# Patient Record
Sex: Female | Born: 1975 | Race: White | Hispanic: No | Marital: Married | Smoking: Former smoker
Health system: Southern US, Community
[De-identification: ages and names within clinical notes are randomized; demographics above are authoritative.]

## PROBLEM LIST (undated history)

## (undated) DIAGNOSIS — L309 Dermatitis, unspecified: Secondary | ICD-10-CM

## (undated) DIAGNOSIS — T7840XA Allergy, unspecified, initial encounter: Secondary | ICD-10-CM

## (undated) DIAGNOSIS — J3089 Other allergic rhinitis: Secondary | ICD-10-CM

## (undated) DIAGNOSIS — Z8619 Personal history of other infectious and parasitic diseases: Secondary | ICD-10-CM

## (undated) DIAGNOSIS — C44519 Basal cell carcinoma of skin of other part of trunk: Secondary | ICD-10-CM

## (undated) HISTORY — DX: Dermatitis, unspecified: L30.9

## (undated) HISTORY — DX: Other allergic rhinitis: J30.89

## (undated) HISTORY — DX: Basal cell carcinoma of skin of other part of trunk: C44.519

## (undated) HISTORY — DX: Personal history of other infectious and parasitic diseases: Z86.19

## (undated) HISTORY — DX: Allergy, unspecified, initial encounter: T78.40XA

---

## 1998-07-06 ENCOUNTER — Other Ambulatory Visit: Admission: RE | Admit: 1998-07-06 | Discharge: 1998-07-06 | Payer: Self-pay | Admitting: Family Medicine

## 1998-07-07 ENCOUNTER — Ambulatory Visit (HOSPITAL_COMMUNITY): Admission: RE | Admit: 1998-07-07 | Discharge: 1998-07-07 | Payer: Self-pay | Admitting: Family Medicine

## 1998-07-07 ENCOUNTER — Encounter: Payer: Self-pay | Admitting: Family Medicine

## 1998-07-12 ENCOUNTER — Ambulatory Visit (HOSPITAL_COMMUNITY): Admission: RE | Admit: 1998-07-12 | Discharge: 1998-07-12 | Payer: Self-pay | Admitting: Family Medicine

## 1998-07-12 ENCOUNTER — Encounter: Payer: Self-pay | Admitting: Family Medicine

## 1998-08-24 ENCOUNTER — Ambulatory Visit (HOSPITAL_COMMUNITY): Admission: RE | Admit: 1998-08-24 | Discharge: 1998-08-24 | Payer: Self-pay | Admitting: *Deleted

## 1998-08-24 ENCOUNTER — Encounter: Payer: Self-pay | Admitting: Family Medicine

## 1999-08-31 ENCOUNTER — Emergency Department (HOSPITAL_COMMUNITY): Admission: EM | Admit: 1999-08-31 | Discharge: 1999-08-31 | Payer: Self-pay | Admitting: Emergency Medicine

## 1999-09-02 ENCOUNTER — Emergency Department (HOSPITAL_COMMUNITY): Admission: EM | Admit: 1999-09-02 | Discharge: 1999-09-02 | Payer: Self-pay | Admitting: Emergency Medicine

## 2000-11-26 ENCOUNTER — Inpatient Hospital Stay (HOSPITAL_COMMUNITY): Admission: AD | Admit: 2000-11-26 | Discharge: 2000-11-28 | Payer: Self-pay | Admitting: Family Medicine

## 2000-12-03 ENCOUNTER — Inpatient Hospital Stay (HOSPITAL_COMMUNITY): Admission: AD | Admit: 2000-12-03 | Discharge: 2000-12-03 | Payer: Self-pay | Admitting: *Deleted

## 2002-07-28 ENCOUNTER — Other Ambulatory Visit: Admission: RE | Admit: 2002-07-28 | Discharge: 2002-07-28 | Payer: Self-pay | Admitting: Obstetrics and Gynecology

## 2008-07-06 ENCOUNTER — Other Ambulatory Visit: Admission: RE | Admit: 2008-07-06 | Discharge: 2008-07-06 | Payer: Self-pay | Admitting: Obstetrics and Gynecology

## 2017-01-21 DIAGNOSIS — C44519 Basal cell carcinoma of skin of other part of trunk: Secondary | ICD-10-CM

## 2017-01-21 HISTORY — DX: Basal cell carcinoma of skin of other part of trunk: C44.519

## 2017-01-21 HISTORY — PX: BASAL CELL CARCINOMA EXCISION: SHX1214

## 2017-07-27 ENCOUNTER — Encounter: Payer: Self-pay | Admitting: Family Medicine

## 2017-07-27 ENCOUNTER — Ambulatory Visit: Payer: Managed Care, Other (non HMO) | Admitting: Family Medicine

## 2017-07-27 VITALS — BP 160/90 | HR 91 | Temp 98.2°F | Ht 65.0 in | Wt 148.4 lb

## 2017-07-27 DIAGNOSIS — Z85828 Personal history of other malignant neoplasm of skin: Secondary | ICD-10-CM | POA: Insufficient documentation

## 2017-07-27 DIAGNOSIS — F410 Panic disorder [episodic paroxysmal anxiety] without agoraphobia: Secondary | ICD-10-CM | POA: Insufficient documentation

## 2017-07-27 DIAGNOSIS — J3089 Other allergic rhinitis: Secondary | ICD-10-CM | POA: Insufficient documentation

## 2017-07-27 DIAGNOSIS — L209 Atopic dermatitis, unspecified: Secondary | ICD-10-CM | POA: Insufficient documentation

## 2017-07-27 DIAGNOSIS — F411 Generalized anxiety disorder: Secondary | ICD-10-CM

## 2017-07-27 DIAGNOSIS — Z23 Encounter for immunization: Secondary | ICD-10-CM

## 2017-07-27 HISTORY — DX: Other allergic rhinitis: J30.89

## 2017-07-27 MED ORDER — ALPRAZOLAM 0.25 MG PO TABS: 0.2500 mg | ORAL_TABLET | Freq: Every day | ORAL | 0 refills | Status: DC | PRN

## 2017-07-27 NOTE — Progress Notes (Signed)
Subjective  CC:  Chief Complaint  Patient presents with  . Establish Care  . Anxiety    worse past 4 months    HPI: Carolyn Carey is a 42 y.o. female who presents to [place] at [facility] today to establish care with me as a new patient.  She has the following concerns or needs:   Very pleasant 23 year old mother of 3 presents to establish care and discuss anxiety concerns.  She reports being diagnosed with panic disorder about 16 years ago.  Although in retrospect, she admits to having a high anxiety level since her adolescence.  Her GAD 7 score today is 19 and she reports that it is negatively affecting her.  She has chronic worry, fear of heights, intermittent panic attacks that can be triggered by height, driving, [place] etc., and poor sleep.  In the past she has used intermittent Xanax to treat panic attacks.  She feels that she can otherwise handle her chronic anxiety behaviorally.  Having the Xanax seems to help lower her level of anxiety.  She has been treated with Zoloft and Lexapro in the past and did not like the way those medicines made her feel.  It sounds like she felt too apathetic.  She denies symptoms of mania or impulsiveness.  She is high functioning.  She denies symptoms of depression.  Health maintenance: She is overdue for a physical exam and we will set that up.  She does have chronic allergies and eczema.  Both are well controlled  We updated and reviewed the patient's past history in detail and it is documented below.  Problem  Atopic Dermatitis  History of Basal Cell Carcinoma  Panic Disorder  Chronic Non-Seasonal Allergic Rhinitis   Health Maintenance  Topic Date Due  . HIV Screening  08/23/1990  . TETANUS/TDAP  08/23/1994  . PAP SMEAR  08/23/1996  . INFLUENZA VACCINE  02/21/2017   There is no immunization history for the selected administration types on file for this patient. Current Meds  Medication Sig  . betamethasone  dipropionate (DIPROLENE) 0.05 % cream Apply 1 application topically 2 (two) times daily.  . cetirizine (ZYRTEC) 10 MG tablet Take 10 mg by mouth daily.  . fluocinonide cream (LIDEX) 1.61 % Apply 1 application topically daily.  . hydrocortisone 2.5 % cream Apply 1 application topically 2 (two) times daily.    Allergies: Patient is allergic to sulfa antibiotics. Past Medical History Patient  has a past medical history of Allergy, Basal cell carcinoma (BCC) of back (01/2017), Chronic non-seasonal allergic rhinitis (07/27/2017), Eczema, and History of chicken pox. Past Surgical History Patient  has a past surgical history that includes Excision basal cell carcinoma (N/A, 01/2017). Family History: Patient family history includes Anxiety disorder in her maternal grandmother; Diabetes Mellitus I in her daughter; Healthy in her son; Hearing loss in her daughter; Heart attack (age of onset: 24) in her father; Heart disease in her father; Hyperlipidemia in her father; Hypertension in her mother; Melanoma in her other. Social History:  Patient  reports that she quit smoking about 5 years ago. Her smoking use included cigarettes. She has a 10.00 pack-year smoking history. she has never used smokeless tobacco. She reports that she drinks alcohol. She reports that she does not use drugs.  Review of Systems: Constitutional: negative for fever or malaise, no sweats Ophthalmic: negative for photophobia, double vision or loss of vision Cardiovascular: negative for chest pain, dyspnea on exertion, or new LE swelling, no palpitations  Respiratory: negative for SOB or persistent cough Gastrointestinal: negative for abdominal pain, change in bowel habits or melena, no diarrhea Genitourinary: negative for dysuria or gross hematuria Musculoskeletal: negative for new gait disturbance or muscular weakness Integumentary: negative for new or persistent rashes Neurological: negative for TIA or stroke symptoms, no  tremor Psychiatric: negative for SI or delusions Allergic/Immunologic: negative for hives  Patient Care Team    Relationship Specialty Notifications Start End  Leamon Arnt, MD PCP - General Family Medicine  07/27/17   Jarome Matin, MD Consulting Physician Dermatology  07/27/17     Objective  Vitals: BP (!) 160/90 (BP Location: Left Arm, Patient Position: Sitting, Cuff Size: Normal)   Pulse 91   Temp 98.2 F (36.8 C)   Ht 5\' 5"  (1.651 m)   Wt 148 lb 6.1 oz (67.3 kg)   LMP 07/02/2017   SpO2 99%   BMI 24.69 kg/m  General:  Well developed, well nourished, no acute distress  Psych:  Alert and oriented,normal mood and affect HEENT:  Normocephalic, atraumatic, non-icteric sclera,mass or thyromegaly Cardiovascular:  RRR without gallop, rub or murmur, nondisplaced PMI Respiratory:  Good breath sounds bilaterally, CTAB with normal respiratory effort MSK: no deformities, contusions. Joints are without erythema or swelling Skin:  Warm, no rashes or suspicious lesions noted Neurologic:    Mental status is normal. Gross motor and sensory exams are normal. Normal gait  Assessment  1. GAD (generalized anxiety disorder)   2. Panic disorder   3. History of basal cell carcinoma   4. Need for prophylactic vaccination and inoculation against influenza      Plan   General anxiety disorder with panic: Seems to be the correct diagnosis.  Discussed different treatment options including SSRI, SNRI, benzodiazepines for intermittent use, and cognitive behavioral therapy.  At this time, patient feels most comfortable with intermittent low-dose Xanax use.  We discussed how this will effectively treat an acute panic attack however does not improve or treat the underlying chronic anxiety levels.  It can help improve her ability to manage it knowing that she will have to suffer through severe panic attack, so for this reason I think it is reasonable.  Low-dose Xanax ordered and we will recheck at her follow-up  visit for a complete physical.  She declines therapy at this time due to cost  Health maintenance: Updated influenza vaccination today.  Follow up: Return for complete physical  Commons side effects, risks, benefits, and alternatives for medications and treatment plan prescribed today were discussed, and the patient expressed understanding of the given instructions. Patient is instructed to call or message via MyChart if he/she has any questions or concerns regarding our treatment plan. No barriers to understanding were identified. We discussed Red Flag symptoms and signs in detail. Patient expressed understanding regarding what to do in case of urgent or emergency type symptoms.   Medication list was reconciled, printed and provided to the patient in AVS. Patient instructions and summary information was reviewed with the patient as documented in the AVS. This note was prepared with assistance of Dragon voice recognition software. Occasional wrong-word or sound-a-like substitutions may have occurred due to the inherent limitations of voice recognition software  Orders Placed This Encounter  Procedures  . Flu Vaccine QUAD 36+ mos IM   No orders of the defined types were placed in this encounter.

## 2017-07-27 NOTE — Patient Instructions (Signed)
Please return in 1-6 months for your complete physical with blood work, please come fasting.  It was a pleasure meeting you today! Thank you for choosing [place] to meet your healthcare needs! I truly look forward to working with you. If you have any questions or concerns, please send me a message via Mychart or call the office at [phone].   Generalized Anxiety Disorder, Adult Generalized anxiety disorder (GAD) is a mental health disorder. People with this condition constantly worry about everyday events. Unlike normal anxiety, worry related to GAD is not triggered by a specific event. These worries also do not fade or get better with time. GAD interferes with life functions, including relationships, work, and school. GAD can vary from mild to severe. People with severe GAD can have intense waves of anxiety with physical symptoms (panic attacks). What are the causes? The exact cause of GAD is not known. What increases the risk? This condition is more likely to develop in:  Women.  People who have a family history of anxiety disorders.  People who are very shy.  People who experience very stressful life events, such as the death of a loved one.  People who have a very stressful family environment.  What are the signs or symptoms? People with GAD often worry excessively about many things in their lives, such as their health and family. They may also be overly concerned about:  Doing well at work.  Being on time.  Natural disasters.  Friendships.  Physical symptoms of GAD include:  Fatigue.  Muscle tension or having muscle twitches.  Trembling or feeling shaky.  Being easily startled.  Feeling like your heart is pounding or racing.  Feeling out of breath or like you cannot take a deep breath.  Having trouble falling asleep or staying asleep.  Sweating.  Nausea, diarrhea, or irritable bowel syndrome (IBS).  Headaches.  Trouble concentrating or remembering  facts.  Restlessness.  Irritability.  How is this diagnosed? Your health care provider can diagnose GAD based on your symptoms and medical history. You will also have a physical exam. The health care provider will ask specific questions about your symptoms, including how severe they are, when they started, and if they come and go. Your health care provider may ask you about your use of alcohol or drugs, including prescription medicines. Your health care provider may refer you to a mental health specialist for further evaluation. Your health care provider will do a thorough examination and may perform additional tests to rule out other possible causes of your symptoms. To be diagnosed with GAD, a person must have anxiety that:  Is out of his or her control.  Affects several different aspects of his or her life, such as work and relationships.  Causes distress that makes him or her unable to take part in normal activities.  Includes at least three physical symptoms of GAD, such as restlessness, fatigue, trouble concentrating, irritability, muscle tension, or sleep problems.  Before your health care provider can confirm a diagnosis of GAD, these symptoms must be present more days than they are not, and they must last for six months or longer. How is this treated? The following therapies are usually used to treat GAD:  Medicine. Antidepressant medicine is usually prescribed for long-term daily control. Antianxiety medicines may be added in severe cases, especially when panic attacks occur.  Talk therapy (psychotherapy). Certain types of talk therapy can be helpful in treating GAD by providing support, education, and guidance. Options include: ?  Cognitive behavioral therapy (CBT). People learn coping skills and techniques to ease their anxiety. They learn to identify unrealistic or negative thoughts and behaviors and to replace them with positive ones. ? Acceptance and commitment therapy (ACT).  This treatment teaches people how to be mindful as a way to cope with unwanted thoughts and feelings. ? Biofeedback. This process trains you to manage your body's response (physiological response) through breathing techniques and relaxation methods. You will work with a therapist while machines are used to monitor your physical symptoms.  Stress management techniques. These include yoga, meditation, and exercise.  A mental health specialist can help determine which treatment is best for you. Some people see improvement with one type of therapy. However, other people require a combination of therapies. Follow these instructions at home:  Take over-the-counter and prescription medicines only as told by your health care provider.  Try to maintain a normal routine.  Try to anticipate stressful situations and allow extra time to manage them.  Practice any stress management or self-calming techniques as taught by your health care provider.  Do not punish yourself for setbacks or for not making progress.  Try to recognize your accomplishments, even if they are small.  Keep all follow-up visits as told by your health care provider. This is important. Contact a health care provider if:  Your symptoms do not get better.  Your symptoms get worse.  You have signs of depression, such as: ? A persistently sad, cranky, or irritable mood. ? Loss of enjoyment in activities that used to bring you joy. ? Change in weight or eating. ? Changes in sleeping habits. ? Avoiding friends or family members. ? Loss of energy for normal tasks. ? Feelings of guilt or worthlessness. Get help right away if:  You have serious thoughts about hurting yourself or others. If you ever feel like you may hurt yourself or others, or have thoughts about taking your own life, get help right away. You can go to your nearest emergency department or call:  Your local emergency services (911 in the [place]  A suicide  crisis helpline, such as the [place] at 661-880-9656. This is open 24 hours a day.  Summary  Generalized anxiety disorder (GAD) is a mental health disorder that involves worry that is not triggered by a specific event.  People with GAD often worry excessively about many things in their lives, such as their health and family.  GAD may cause physical symptoms such as restlessness, trouble concentrating, sleep problems, frequent sweating, nausea, diarrhea, headaches, and trembling or muscle twitching.  A mental health specialist can help determine which treatment is best for you. Some people see improvement with one type of therapy. However, other people require a combination of therapies. This information is not intended to replace advice given to you by your health care provider. Make sure you discuss any questions you have with your health care provider. Document Released: 11/04/2012 Document Revised: 05/30/2016 Document Reviewed: 05/30/2016 Elsevier Interactive Patient Education  [employer].

## 2017-08-30 ENCOUNTER — Encounter: Payer: Self-pay | Admitting: Physician Assistant

## 2017-08-30 ENCOUNTER — Ambulatory Visit (INDEPENDENT_AMBULATORY_CARE_PROVIDER_SITE_OTHER): Payer: Managed Care, Other (non HMO) | Admitting: Physician Assistant

## 2017-08-30 ENCOUNTER — Other Ambulatory Visit: Payer: Self-pay

## 2017-08-30 VITALS — BP 130/88 | HR 71 | Temp 97.8°F | Resp 16 | Ht 65.0 in | Wt 149.0 lb

## 2017-08-30 DIAGNOSIS — G4700 Insomnia, unspecified: Secondary | ICD-10-CM | POA: Diagnosis not present

## 2017-08-30 DIAGNOSIS — F410 Panic disorder [episodic paroxysmal anxiety] without agoraphobia: Secondary | ICD-10-CM

## 2017-08-30 DIAGNOSIS — Z0000 Encounter for general adult medical examination without abnormal findings: Secondary | ICD-10-CM | POA: Diagnosis not present

## 2017-08-30 DIAGNOSIS — Z1231 Encounter for screening mammogram for malignant neoplasm of breast: Secondary | ICD-10-CM | POA: Diagnosis not present

## 2017-08-30 DIAGNOSIS — Z1239 Encounter for other screening for malignant neoplasm of breast: Secondary | ICD-10-CM

## 2017-08-30 DIAGNOSIS — Z23 Encounter for immunization: Secondary | ICD-10-CM

## 2017-08-30 LAB — LIPID PANEL
CHOL/HDL RATIO: 5
Cholesterol: 245 mg/dL — ABNORMAL HIGH (ref 0–200)
HDL: 49.7 mg/dL (ref >39.00)
LDL CALC: 168 mg/dL — AB (ref 0–99)
NonHDL: 195.23
TRIGLYCERIDES: 136 mg/dL (ref 0.0–149.0)
VLDL: 27.2 mg/dL (ref 0.0–40.0)

## 2017-08-30 LAB — CBC WITH DIFFERENTIAL/PLATELET
BASOS PCT: 1.3 % (ref 0.0–3.0)
Basophils Absolute: 0.1 10*3/uL (ref 0.0–0.1)
EOS ABS: 0.1 10*3/uL (ref 0.0–0.7)
Eosinophils Relative: 2.4 % (ref 0.0–5.0)
HCT: 42.6 % (ref 36.0–46.0)
Hemoglobin: 14 g/dL (ref 12.0–15.0)
Lymphocytes Relative: 33.4 % (ref 12.0–46.0)
Lymphs Abs: 1.7 10*3/uL (ref 0.7–4.0)
MCHC: 32.8 g/dL (ref 30.0–36.0)
MCV: 88.6 fl (ref 78.0–100.0)
MONO ABS: 0.5 10*3/uL (ref 0.1–1.0)
Monocytes Relative: 8.8 % (ref 3.0–12.0)
NEUTROS ABS: 2.8 10*3/uL (ref 1.4–7.7)
Neutrophils Relative %: 54.1 % (ref 43.0–77.0)
Platelets: 318 10*3/uL (ref 150.0–400.0)
RBC: 4.81 Mil/uL (ref 3.87–5.11)
RDW: 13.6 % (ref 11.5–15.5)
WBC: 5.2 10*3/uL (ref 4.0–10.5)

## 2017-08-30 LAB — COMPREHENSIVE METABOLIC PANEL
ALT: 23 U/L (ref 0–35)
AST: 20 U/L (ref 0–37)
Albumin: 4.5 g/dL (ref 3.5–5.2)
Alkaline Phosphatase: 49 U/L (ref 39–117)
BUN: 8 mg/dL (ref 6–23)
CHLORIDE: 104 meq/L (ref 96–112)
CO2: 27 mEq/L (ref 19–32)
CREATININE: 0.62 mg/dL (ref 0.40–1.20)
Calcium: 9.4 mg/dL (ref 8.4–10.5)
GFR: 112.19 mL/min (ref >60.00)
Glucose, Bld: 99 mg/dL (ref 70–99)
POTASSIUM: 4 meq/L (ref 3.5–5.1)
SODIUM: 138 meq/L (ref 135–145)
Total Bilirubin: 0.7 mg/dL (ref 0.2–1.2)
Total Protein: 7.4 g/dL (ref 6.0–8.3)

## 2017-08-30 LAB — TSH: TSH: 3.11 u[IU]/mL (ref 0.35–4.50)

## 2017-08-30 LAB — HEMOGLOBIN A1C: HEMOGLOBIN A1C: 5.7 % (ref 4.6–6.5)

## 2017-08-30 MED ORDER — TRAZODONE HCL 50 MG PO TABS: 25.0000 mg | ORAL_TABLET | Freq: Every evening | ORAL | 0 refills | Status: AC | PRN

## 2017-08-30 MED ORDER — FLUTICASONE PROPIONATE 50 MCG/ACT NA SUSP: 2.0000 | Freq: Every day | NASAL | 6 refills | Status: DC

## 2017-08-30 NOTE — Progress Notes (Signed)
Patient presents to clinic today for annual exam.  Patient is fasting for labs. Patient defers GYN examination today.   Diet -- Endorses diet is well-balanced overall. Is staying well-hydrated. Has a green vegetable with all meals. Drinks mainly water.   Exercise -- Patient usually exercises regularly -- walking/running. Is more limited in the winter months.   Acute Concerns: Patient endorses 2 days of left-sided sinus pressure and sinus pain (frontal and maxillary). States that she has had nasal congestion and sinus congestion over the past 2 weeks intermittently. Notes some left ear pain.   Patient with history of anxiety over the past 16 years. Was previously on multiple SSRIs that made her feel like "a zombie". Was recently seen by Dr. Jonni Sanger who started her on PRN Xanax while having her work on stress relief tactics. She is taking Xanax only as needed which is starting to average about 3-4 x per week. Notes still having significant overall anxiety and difficulty with both falling and staying asleep. Is wanting to discuss other options for treatment.  Health Maintenance: Immunizations -- up-to-date. Mammogram -- Patient requesting mammogram. Defers breast exam to PCP. Denies concerns today.  PAP -- Overdue. Patient currently on menstrual period. Defers PAP to PCP.  Past Medical History:  Diagnosis Date  . Allergy   . Basal cell carcinoma (BCC) of back 01/2017  . Chronic non-seasonal allergic rhinitis 07/27/2017  . Eczema   . History of chicken pox     Past Surgical History:  Procedure Laterality Date  . BASAL CELL CARCINOMA EXCISION N/A 01/2017   on Back    Current Outpatient Medications on File Prior to Visit  Medication Sig Dispense Refill  . ALPRAZolam (XANAX) 0.25 MG tablet Take 1-2 tablets (0.25-0.5 mg total) by mouth daily as needed for anxiety. 20 tablet 0  . betamethasone dipropionate (DIPROLENE) 0.05 % cream Apply 1 application topically 2 (two) times daily.    .  fluocinonide cream (LIDEX) 1.66 % Apply 1 application topically daily.    . hydrocortisone 2.5 % cream Apply 1 application topically 2 (two) times daily.     No current facility-administered medications on file prior to visit.     Allergies  Allergen Reactions  . Sulfa Antibiotics Hives    Family History  Problem Relation Age of Onset  . Hypertension Mother   . Heart disease Father   . Heart attack Father 82       s/p CABG  . Hyperlipidemia Father   . Diabetes Mellitus I Daughter   . Healthy Son   . Anxiety disorder Maternal Grandmother   . Melanoma Other   . Hearing loss Daughter     Social History   Socioeconomic History  . Marital status: Married    Spouse name: Joe  . Number of children: 3  . Years of education: Not on file  . Highest education level: Not on file  Social Needs  . Financial resource strain: Not on file  . Food insecurity - worry: Not on file  . Food insecurity - inability: Not on file  . Transportation needs - medical: Not on file  . Transportation needs - non-medical: Not on file  Occupational History  . Occupation: Engineer, materials  Tobacco Use  . Smoking status: Former Smoker    Packs/day: 1.00    Years: 10.00    Pack years: 10.00    Types: Cigarettes    Last attempt to quit: 07/24/2012    Years since quitting: 5.1  .  Smokeless tobacco: Never Used  Substance and Sexual Activity  . Alcohol use: Yes    Comment: occassionally  . Drug use: No  . Sexual activity: Yes    Comment: Husband Vasectomy  Other Topics Concern  . Not on file  Social History Narrative  . Not on file   Review of Systems  Constitutional: Negative for fever and weight loss.  HENT: Negative for ear discharge, ear pain, hearing loss and tinnitus.   Eyes: Negative for blurred vision, double vision, photophobia and pain.  Respiratory: Negative for cough and shortness of breath.   Cardiovascular: Negative for chest pain and palpitations.  Gastrointestinal: Negative  for abdominal pain, blood in stool, constipation, diarrhea, heartburn, melena, nausea and vomiting.  Genitourinary: Negative for dysuria, flank pain, frequency, hematuria and urgency.  Musculoskeletal: Negative for falls.  Neurological: Negative for dizziness, loss of consciousness and headaches.  Endo/Heme/Allergies: Negative for environmental allergies.  Psychiatric/Behavioral: Negative for depression, hallucinations, substance abuse and suicidal ideas. The patient is not nervous/anxious and does not have insomnia.    BP 130/88   Pulse 71   Temp 97.8 F (36.6 C) (Oral)   Resp 16   Ht 5\' 5"  (1.651 m)   Wt 149 lb (67.6 kg)   LMP 08/29/2017   SpO2 99%   BMI 24.79 kg/m   Physical Exam  Constitutional: She is oriented to person, place, and time and well-developed, well-nourished, and in no distress.  HENT:  Head: Normocephalic and atraumatic.  Right Ear: Tympanic membrane, external ear and ear canal normal.  Left Ear: Tympanic membrane, external ear and ear canal normal.  Nose: Nose normal. No mucosal edema.  Mouth/Throat: Uvula is midline, oropharynx is clear and moist and mucous membranes are normal. No oropharyngeal exudate or posterior oropharyngeal erythema.  Eyes: Conjunctivae are normal. Pupils are equal, round, and reactive to light.  Neck: Neck supple. No thyromegaly present.  Cardiovascular: Normal rate, regular rhythm, normal heart sounds and intact distal pulses.  Pulmonary/Chest: Effort normal and breath sounds normal. No respiratory distress. She has no wheezes. She has no rales.  Abdominal: Soft. Bowel sounds are normal. She exhibits no distension and no mass. There is no tenderness. There is no rebound and no guarding.  Lymphadenopathy:    She has no cervical adenopathy.  Neurological: She is alert and oriented to person, place, and time. No cranial nerve deficit.  Skin: Skin is warm and dry. No rash noted.  Psychiatric: Affect normal.  Vitals  reviewed.  Assessment/Plan: Breast cancer screening Order for screening mammogram placed.  Patient endorses no concerns today. Defers Breast Exam to PCP who she is scheduling PAP with.   Panic disorder Panic and generalized anxiety. Has previously attempted and failed multiple SSRIs. Does not want a medication for "depression". Discussed free and low-cost counseling options. Encouraged patient to download the [employer] application on her phone to help with counseling and mindfulness training for anxiety. Will start trial of 25 mg of Trazodone for sleep and anxiety. Close follow-up to be scheduled with PCP.  Visit for preventive health examination Depression screen negative. Health Maintenance reviewed. Preventive schedule discussed and handout given in AVS. Will obtain fasting labs today.     Leeanne Rio, PA-C

## 2017-08-30 NOTE — Patient Instructions (Signed)
Please go to the lab for blood work.   Our office will call you with your results unless you have chosen to receive results via MyChart.  If your blood work is normal we will follow-up each year for physicals and as scheduled for chronic medical problems.  If anything is abnormal we will treat accordingly and get you in for a follow-up.  Please start a trial of the Trazodone at night for sleep and anxiety.  Use the Xanax on an as-needed basis for severe anxiety only.  Keep up with exercise as this can help with anxiety and stress.  Download the HeadSpace app on your phone to start some mindfulness training to help anxiety.  Also look up [facility] because I believe they offer free/reduced cost counseling. Check with insurance as they often cover so many counseling sessions a year.  Follow-up with Dr. Jonni Sanger in 2 weeks for gynecological exam with PAP and follow-up.   Preventive Care 40-64 Years, Female Preventive care refers to lifestyle choices and visits with your health care provider that can promote health and wellness. What does preventive care include?  A yearly physical exam. This is also called an annual well check.  Dental exams once or twice a year.  Routine eye exams. Ask your health care provider how often you should have your eyes checked.  Personal lifestyle choices, including: ? Daily care of your teeth and gums. ? Regular physical activity. ? Eating a healthy diet. ? Avoiding tobacco and drug use. ? Limiting alcohol use. ? Practicing safe sex. ? Taking low-dose aspirin daily starting at age 27. ? Taking vitamin and mineral supplements as recommended by your health care provider. What happens during an annual well check? The services and screenings done by your health care provider during your annual well check will depend on your age, overall health, lifestyle risk factors, and family history of disease. Counseling Your health care provider  may ask you questions about your:  Alcohol use.  Tobacco use.  Drug use.  Emotional well-being.  Home and relationship well-being.  Sexual activity.  Eating habits.  Work and work Statistician.  Method of birth control.  Menstrual cycle.  Pregnancy history.  Screening You may have the following tests or measurements:  Height, weight, and BMI.  Blood pressure.  Lipid and cholesterol levels. These may be checked every 5 years, or more frequently if you are over 1 years old.  Skin check.  Lung cancer screening. You may have this screening every year starting at age 92 if you have a 30-pack-year history of smoking and currently smoke or have quit within the past 15 years.  Fecal occult blood test (FOBT) of the stool. You may have this test every year starting at age 61.  Flexible sigmoidoscopy or colonoscopy. You may have a sigmoidoscopy every 5 years or a colonoscopy every 10 years starting at age 50.  Hepatitis C blood test.  Hepatitis B blood test.  Sexually transmitted disease (STD) testing.  Diabetes screening. This is done by checking your blood sugar (glucose) after you have not eaten for a while (fasting). You may have this done every 1-3 years.  Mammogram. This may be done every 1-2 years. Talk to your health care provider about when you should start having regular mammograms. This may depend on whether you have a family history of breast cancer.  BRCA-related cancer screening. This may be done if you have a family history of breast, ovarian, tubal, or peritoneal  cancers.  Pelvic exam and Pap test. This may be done every 3 years starting at age 28. Starting at age 65, this may be done every 5 years if you have a Pap test in combination with an HPV test.  Bone density scan. This is done to screen for osteoporosis. You may have this scan if you are at high risk for osteoporosis.  Discuss your test results, treatment options, and if necessary, the need for  more tests with your health care provider. Vaccines Your health care provider may recommend certain vaccines, such as:  Influenza vaccine. This is recommended every year.  Tetanus, diphtheria, and acellular pertussis (Tdap, Td) vaccine. You may need a Td booster every 10 years.  Varicella vaccine. You may need this if you have not been vaccinated.  Zoster vaccine. You may need this after age 70.  Measles, mumps, and rubella (MMR) vaccine. You may need at least one dose of MMR if you were born in 1957 or later. You may also need a second dose.  Pneumococcal 13-valent conjugate (PCV13) vaccine. You may need this if you have certain conditions and were not previously vaccinated.  Pneumococcal polysaccharide (PPSV23) vaccine. You may need one or two doses if you smoke cigarettes or if you have certain conditions.  Meningococcal vaccine. You may need this if you have certain conditions.  Hepatitis A vaccine. You may need this if you have certain conditions or if you travel or work in places where you may be exposed to hepatitis A.  Hepatitis B vaccine. You may need this if you have certain conditions or if you travel or work in places where you may be exposed to hepatitis B.  Haemophilus influenzae type b (Hib) vaccine. You may need this if you have certain conditions.  Talk to your health care provider about which screenings and vaccines you need and how often you need them. This information is not intended to replace advice given to you by your health care provider. Make sure you discuss any questions you have with your health care provider. Document Released: 08/06/2015 Document Revised: 03/29/2016 Document Reviewed: 05/11/2015 Elsevier Interactive Patient Education  [employer].

## 2017-09-01 NOTE — Assessment & Plan Note (Signed)
Panic and generalized anxiety. Has previously attempted and failed multiple SSRIs. Does not want a medication for "depression". Discussed free and low-cost counseling options. Encouraged patient to download the HeadSpace application on her phone to help with counseling and mindfulness training for anxiety. Will start trial of 25 mg of Trazodone for sleep and anxiety. Close follow-up to be scheduled with PCP.

## 2017-09-01 NOTE — Assessment & Plan Note (Signed)
Order for screening mammogram placed.  Patient endorses no concerns today. Defers Breast Exam to PCP who she is scheduling PAP with.

## 2017-09-01 NOTE — Assessment & Plan Note (Signed)
Depression screen negative. Health Maintenance reviewed. Preventive schedule discussed and handout given in AVS. Will obtain fasting labs today.  

## 2017-09-03 ENCOUNTER — Encounter: Payer: Self-pay | Admitting: Emergency Medicine

## 2017-09-04 ENCOUNTER — Other Ambulatory Visit: Payer: Self-pay | Admitting: Physician Assistant

## 2017-09-04 DIAGNOSIS — E785 Hyperlipidemia, unspecified: Secondary | ICD-10-CM

## 2017-09-04 MED ORDER — ATORVASTATIN CALCIUM 10 MG PO TABS
10.0000 mg | ORAL_TABLET | Freq: Every day | ORAL | 1 refills | Status: DC
Start: 2017-09-04 — End: 2017-09-10

## 2017-09-10 ENCOUNTER — Other Ambulatory Visit: Payer: Self-pay

## 2017-09-10 ENCOUNTER — Other Ambulatory Visit (HOSPITAL_COMMUNITY)
Admission: RE | Admit: 2017-09-10 | Discharge: 2017-09-10 | Disposition: A | Discharge status: Home / Self Care | Payer: Managed Care, Other (non HMO) | Source: Ambulatory Visit | Attending: Family Medicine | Admitting: Family Medicine

## 2017-09-10 ENCOUNTER — Encounter: Payer: Self-pay | Admitting: Family Medicine

## 2017-09-10 ENCOUNTER — Ambulatory Visit: Payer: Managed Care, Other (non HMO) | Admitting: Family Medicine

## 2017-09-10 VITALS — BP 142/98 | HR 81 | Temp 98.1°F

## 2017-09-10 DIAGNOSIS — F411 Generalized anxiety disorder: Secondary | ICD-10-CM | POA: Diagnosis not present

## 2017-09-10 DIAGNOSIS — Z124 Encounter for screening for malignant neoplasm of cervix: Secondary | ICD-10-CM

## 2017-09-10 DIAGNOSIS — F410 Panic disorder [episodic paroxysmal anxiety] without agoraphobia: Secondary | ICD-10-CM | POA: Diagnosis not present

## 2017-09-10 DIAGNOSIS — Z1239 Encounter for other screening for malignant neoplasm of breast: Secondary | ICD-10-CM

## 2017-09-10 MED ORDER — SERTRALINE HCL 25 MG PO TABS: 25.0000 mg | ORAL_TABLET | Freq: Every day | ORAL | 2 refills | Status: DC

## 2017-09-10 NOTE — Progress Notes (Signed)
Subjective  CC:  Chief Complaint  Patient presents with  . Anxiety  . Gynecologic Exam    HPI: Carolyn Carey is a 42 y.o. female who presents to the office today to address the problems listed above in the chief complaint.  Had complete physical with labs 2/07. I reviewed the note and results.   Started on lipitor for ldl of 168, however ASCVD risk score is 1.2%. Has improved diet; eating less fatty foods.   Panic/GAD: active. Using xanaX INTERMITTENTLY.  Has used about 10 pills in last 6 weeks. Works well but feeling anxious most days of the week. Sleep is not good. Has had added stress from daughter as well. Started on trazodone for sleep and that has helped. She is anxious today with an elevated bp response as well. No sxs of depression. Reviewed past meds for same: zoloft - became apathetic after about 6 months; paxil and lexapro - didn't like how it made her feel. Never has been on wellbutrin nor buspar. Still with occasional panic. No SI. Neg depression screen last week.  I reviewed the patients updated PMH, FH, and SocHx.    Patient Active Problem List   Diagnosis Date Noted  . Atopic dermatitis 07/27/2017  . History of basal cell carcinoma 07/27/2017  . Panic disorder 07/27/2017  . Chronic non-seasonal allergic rhinitis 07/27/2017   Current Meds  Medication Sig  . ALPRAZolam (XANAX) 0.25 MG tablet Take 1-2 tablets (0.25-0.5 mg total) by mouth daily as needed for anxiety.  . betamethasone dipropionate (DIPROLENE) 0.05 % cream Apply 1 application topically 2 (two) times daily.  . fluocinonide cream (LIDEX) 2.95 % Apply 1 application topically daily.  . fluticasone (FLONASE) 50 MCG/ACT nasal spray Place 2 sprays into both nostrils daily.  . hydrocortisone 2.5 % cream Apply 1 application topically 2 (two) times daily.  . traZODone (DESYREL) 50 MG tablet Take 0.5 tablets (25 mg total) by mouth at bedtime as needed for sleep.  . [DISCONTINUED] atorvastatin (LIPITOR) 10 MG  tablet Take 1 tablet (10 mg total) by mouth daily.    Allergies: Patient is allergic to sulfa antibiotics. Family History: Patient family history includes Anxiety disorder in her maternal grandmother; Diabetes Mellitus I in her daughter; Healthy in her son; Hearing loss in her daughter; Heart attack (age of onset: 10) in her father; Heart disease in her father; Hyperlipidemia in her father; Hypertension in her mother; Melanoma in her other. Social History:  Patient  reports that she quit smoking about 5 years ago. Her smoking use included cigarettes. She has a 10.00 pack-year smoking history. she has never used smokeless tobacco. She reports that she drinks alcohol. She reports that she does not use drugs.  Review of Systems: Constitutional: Negative for fever malaise or anorexia Cardiovascular: negative for chest pain Respiratory: negative for SOB or persistent cough Gastrointestinal: negative for abdominal pain  Objective  Vitals: BP (!) 142/98 (BP Location: Left Arm, Patient Position: Sitting, Cuff Size: Normal)   Pulse 81   Temp 98.1 F (36.7 C)   LMP 08/29/2017   SpO2 98%  General: no acute distress , A&Ox3 HEENT: PEERL, conjunctiva normal, Oropharynx moist,neck is supple Cardiovascular:  RRR without murmur or gallop.  Respiratory:  Good breath sounds bilaterally, CTAB with normal respiratory effort Skin:  Warm, no rashes Physical Exam  Genitourinary: Vagina normal and uterus normal. Pelvic exam was performed with patient prone. There is no rash or lesion on the right labia. There is no rash or lesion  on the left labia. Cervix exhibits no motion tenderness and no discharge. Right adnexum displays no mass, no tenderness and no fullness. Left adnexum displays no mass, no tenderness and no fullness.    Assessment  1. GAD (generalized anxiety disorder)   2. Panic disorder   3. Cervical cancer screening   4. Breast cancer screening      Plan   GAD/panic:  Counseling done  again. Due to high level of chronic anxiety, suggest retrial of SSRI. Restart zoloft and recheck in 6 weeks. Titrate dose up slowly. If intolerable or ineffective, consider wellbutrin/buspar. Continue prn xanax.   Pap with HPV screen done.   Ordered mammogram.   Follow up: 6 weeks to recheck mood    Commons side effects, risks, benefits, and alternatives for medications and treatment plan prescribed today were discussed, and the patient expressed understanding of the given instructions. Patient is instructed to call or message via MyChart if he/she has any questions or concerns regarding our treatment plan. No barriers to understanding were identified. We discussed Red Flag symptoms and signs in detail. Patient expressed understanding regarding what to do in case of urgent or emergency type symptoms.   Medication list was reconciled, printed and provided to the patient in AVS. Patient instructions and summary information was reviewed with the patient as documented in the AVS. This note was prepared with assistance of Dragon voice recognition software. Occasional wrong-word or sound-a-like substitutions may have occurred due to the inherent limitations of voice recognition software  Orders Placed This Encounter  Procedures  . MM DIGITAL SCREENING BILATERAL   Meds ordered this encounter  Medications  . sertraline (ZOLOFT) 25 MG tablet    Sig: Take 1 tablet (25 mg total) by mouth daily.    Dispense:  30 tablet    Refill:  2

## 2017-09-10 NOTE — Patient Instructions (Signed)
Please return in 6 weeks to recheck mood.  If you have any questions or concerns, please don't hesitate to send me a message via MyChart or call the office at [phone]. Thank you for visiting with [place] today! It's our pleasure caring for you.  Depression Medications:  Taking the medicine as directed and not missing any doses is one of the best things you can do to treat your depression.  Here are some things to keep in mind:  1) Side effects (stomach upset, some increased anxiety) may happen before you notice a benefit.  These side effects typically go away over time. 2) Changes to your dose of medicine or a change in medication all together is sometimes necessary 3) Most people need to be on medication at least 6-12 months 4) Many people will notice an improvement within two weeks but the full effect of the medication can take up to 4-6 weeks 5) Stopping the medication when you start feeling better often results in a return of symptoms 6) If you start having thoughts of hurting yourself or others after starting this medicine, please call the office immediately at [phone].

## 2017-09-11 LAB — CYTOLOGY - PAP
Diagnosis: NEGATIVE
HPV: NOT DETECTED

## 2017-09-21 ENCOUNTER — Ambulatory Visit
Admission: RE | Admit: 2017-09-21 | Discharge: 2017-09-21 | Disposition: A | Discharge status: Home / Self Care | Payer: Managed Care, Other (non HMO) | Source: Ambulatory Visit | Attending: Physician Assistant | Admitting: Physician Assistant

## 2017-09-21 DIAGNOSIS — Z1239 Encounter for other screening for malignant neoplasm of breast: Secondary | ICD-10-CM

## 2017-09-24 ENCOUNTER — Telehealth: Payer: Self-pay | Admitting: Family Medicine

## 2017-09-24 NOTE — Telephone Encounter (Signed)
Copied from [place] [id number]. Topic: Quick Communication - Lab Results >> Sep 24, 2017 11:31 AM Cecelia Byars, NT wrote: Patient returned call from the practice to get her lab results ,triage may disclose  please call her at [phone]

## 2017-09-25 NOTE — Telephone Encounter (Signed)
Left message to call back about lab results. Pt. Is in result note.

## 2017-10-22 ENCOUNTER — Ambulatory Visit: Payer: Managed Care, Other (non HMO) | Admitting: Family Medicine

## 2017-10-25 ENCOUNTER — Ambulatory Visit: Payer: Managed Care, Other (non HMO) | Admitting: Family Medicine

## 2017-10-25 ENCOUNTER — Encounter: Payer: Self-pay | Admitting: Family Medicine

## 2017-10-25 ENCOUNTER — Other Ambulatory Visit: Payer: Self-pay

## 2017-10-25 VITALS — BP 136/82 | HR 92 | Temp 97.8°F | Resp 16 | Ht 65.0 in | Wt 148.4 lb

## 2017-10-25 DIAGNOSIS — F410 Panic disorder [episodic paroxysmal anxiety] without agoraphobia: Secondary | ICD-10-CM | POA: Diagnosis not present

## 2017-10-25 DIAGNOSIS — J301 Allergic rhinitis due to pollen: Secondary | ICD-10-CM | POA: Diagnosis not present

## 2017-10-25 DIAGNOSIS — F5104 Psychophysiologic insomnia: Secondary | ICD-10-CM

## 2017-10-25 DIAGNOSIS — F411 Generalized anxiety disorder: Secondary | ICD-10-CM

## 2017-10-25 MED ORDER — CETIRIZINE HCL 10 MG PO TABS: 10.0000 mg | ORAL_TABLET | Freq: Every day | ORAL | 11 refills | Status: AC

## 2017-10-25 MED ORDER — MONTELUKAST SODIUM 10 MG PO TABS: 10.0000 mg | ORAL_TABLET | Freq: Every day | ORAL | 3 refills | Status: AC

## 2017-10-25 MED ORDER — SERTRALINE HCL 50 MG PO TABS: 50.0000 mg | ORAL_TABLET | Freq: Every day | ORAL | 1 refills | Status: AC

## 2017-10-25 MED ORDER — ALPRAZOLAM 0.25 MG PO TABS: 0.2500 mg | ORAL_TABLET | Freq: Every day | ORAL | 0 refills | Status: AC | PRN

## 2017-10-25 NOTE — Progress Notes (Signed)
Subjective  CC:  Chief Complaint  Patient presents with  . Allergic Rhinitis     Patient states that she has had allergies but medicine is not always working, had a stomach virus for the past 2 days but feeling better today  . Anxiety    HPI: Carolyn Carey is a 42 y.o. female who presents to the office today to address the problems listed above in the chief complaint, mood problems.  Started on low dose zoloft at last visit 6 weeks ago. She is noticing improvement w/o AEs. Feeling overall better. Still with some anxiety / panic sxs requiring xanax intermittently. Is in the process of maybe moving to [place] for husband's job and this is stressing her. Could be a good thing overall but she's not sure. No low mood. Sleep is ok with intermittent trazadone use.  She denies current suicidal or homicidal plan or intent.  Depression screen [facility] 2/9 09/10/2017 07/27/2017  Decreased Interest 0 0  Down, Depressed, Hopeless 0 0  PHQ - 2 Score 0 0   SAR is active in spite of flonase and zyrtec (failed xyzal). No infectious sxs. No eye sxs. No wheezing or h/o asthma. Does have eczema.   I reviewed the patients updated PMH, FH, and SocHx.    Patient Active Problem List   Diagnosis Date Noted  . Psychophysiological insomnia 10/25/2017  . GAD (generalized anxiety disorder) 10/25/2017  . Atopic dermatitis 07/27/2017  . History of basal cell carcinoma 07/27/2017  . Panic disorder 07/27/2017  . Chronic non-seasonal allergic rhinitis 07/27/2017   Current Meds  Medication Sig  . ALPRAZolam (XANAX) 0.25 MG tablet Take 1-2 tablets (0.25-0.5 mg total) by mouth daily as needed for anxiety.  . betamethasone dipropionate (DIPROLENE) 0.05 % cream Apply 1 application topically 2 (two) times daily.  . fluocinonide cream (LIDEX) 7.61 % Apply 1 application topically daily.  . fluticasone (FLONASE) 50 MCG/ACT nasal spray Place 2 sprays into both nostrils daily.  . hydrocortisone 2.5 % cream Apply 1  application topically 2 (two) times daily.  . sertraline (ZOLOFT) 50 MG tablet Take 1 tablet (50 mg total) by mouth daily.  . traZODone (DESYREL) 50 MG tablet Take 0.5 tablets (25 mg total) by mouth at bedtime as needed for sleep.  . [DISCONTINUED] ALPRAZolam (XANAX) 0.25 MG tablet Take 1-2 tablets (0.25-0.5 mg total) by mouth daily as needed for anxiety.  . [DISCONTINUED] sertraline (ZOLOFT) 25 MG tablet Take 1 tablet (25 mg total) by mouth daily.    Allergies: Patient is allergic to sulfa antibiotics. Family history:  Patient family history includes Anxiety disorder in her maternal grandmother; Diabetes Mellitus I in her daughter; Healthy in her son; Hearing loss in her daughter; Heart attack (age of onset: 59) in her father; Heart disease in her father; Hyperlipidemia in her father; Hypertension in her mother; Melanoma in her other. Social History   Socioeconomic History  . Marital status: Married    Spouse name: Joe  . Number of children: 3  . Years of education: Not on file  . Highest education level: Not on file  Occupational History  . Occupation: Engineer, materials  Social Needs  . Financial resource strain: Not on file  . Food insecurity:    Worry: Not on file    Inability: Not on file  . Transportation needs:    Medical: Not on file    Non-medical: Not on file  Tobacco Use  . Smoking status: Former Smoker    Packs/day: 1.00  Years: 10.00    Pack years: 10.00    Types: Cigarettes    Last attempt to quit: 07/24/2012    Years since quitting: 5.2  . Smokeless tobacco: Never Used  Substance and Sexual Activity  . Alcohol use: Yes    Comment: occassionally  . Drug use: No  . Sexual activity: Yes    Comment: Husband Vasectomy  Lifestyle  . Physical activity:    Days per week: Not on file    Minutes per session: Not on file  . Stress: Not on file  Relationships  . Social connections:    Talks on phone: Not on file    Gets together: Not on file    Attends  religious service: Not on file    Active member of club or organization: Not on file    Attends meetings of clubs or organizations: Not on file    Relationship status: Not on file  Other Topics Concern  . Not on file  Social History Narrative  . Not on file     Review of Systems: Constitutional: Negative for fever malaise or anorexia Cardiovascular: negative for chest pain Respiratory: negative for SOB or persistent cough Gastrointestinal: negative for abdominal pain  Objective  Vitals: BP 136/82   Pulse 92   Temp 97.8 F (36.6 C) (Oral)   Resp 16   Ht 5\' 5"  (1.651 m)   Wt 148 lb 6.4 oz (67.3 kg)   LMP 10/01/2017   SpO2 98%   BMI 24.70 kg/m  General: no acute distress, well appearing, no apparent distress, well groomed Psych:  Alert and oriented x 3,normal mood, behavior, speech, dress, and thought processes.   Assessment  1. GAD (generalized anxiety disorder)   2. Panic disorder   3. Psychophysiological insomnia   4. Seasonal allergic rhinitis due to pollen      Plan   GAD/Panic: improved on zoloft. Increase to 50mg  daily and recheck in 3 months. Ok to use xanax as needed - only on 0.25mg . Discussed goal of increasing meds so won't need xanax. She agrees.   SAR - add singulair and nasal saline washes.   Reviewed concept of mood problems caused by biochemical imbalance of neurotransmitters and rationale for treatment with medications and therapy.    Follow up: Return in about 3 months (around 01/24/2018) for mood follow up.    Commons side effects, risks, benefits, and alternatives for medications and treatment plan prescribed today were discussed, and the patient expressed understanding of the given instructions. Patient is instructed to call or message via MyChart if he/she has any questions or concerns regarding our treatment plan. No barriers to understanding were identified. We discussed Red Flag symptoms and signs in detail. Patient expressed understanding  regarding what to do in case of urgent or emergency type symptoms.   Medication list was reconciled, printed and provided to the patient in AVS. Patient instructions and summary information was reviewed with the patient as documented in the AVS. This note was prepared with assistance of Dragon voice recognition software. Occasional wrong-word or sound-a-like substitutions may have occurred due to the inherent limitations of voice recognition software  No orders of the defined types were placed in this encounter.  Meds ordered this encounter  Medications  . sertraline (ZOLOFT) 50 MG tablet    Sig: Take 1 tablet (50 mg total) by mouth daily.    Dispense:  90 tablet    Refill:  1  . ALPRAZolam (XANAX) 0.25 MG tablet  Sig: Take 1-2 tablets (0.25-0.5 mg total) by mouth daily as needed for anxiety.    Dispense:  20 tablet    Refill:  0  . cetirizine (ZYRTEC) 10 MG tablet    Sig: Take 1 tablet (10 mg total) by mouth daily.    Dispense:  30 tablet    Refill:  11  . montelukast (SINGULAIR) 10 MG tablet    Sig: Take 1 tablet (10 mg total) by mouth at bedtime.    Dispense:  30 tablet    Refill:  3

## 2017-10-25 NOTE — Patient Instructions (Signed)
Please follow up if symptoms do not improve or as needed.   

## 2017-10-30 ENCOUNTER — Other Ambulatory Visit: Payer: Self-pay | Admitting: Physician Assistant

## 2017-10-30 NOTE — Telephone Encounter (Signed)
Last OV 10/25/2017 Last Fill Date 08/30/2017

## 2018-01-22 ENCOUNTER — Ambulatory Visit: Payer: Managed Care, Other (non HMO) | Admitting: Family Medicine

## 2018-07-08 ENCOUNTER — Telehealth: Payer: Self-pay | Admitting: Family Medicine

## 2018-07-08 ENCOUNTER — Other Ambulatory Visit: Payer: Self-pay | Admitting: Physician Assistant

## 2018-07-08 NOTE — Telephone Encounter (Signed)
Copied from [place] [id number]. Topic: Quick Communication - Rx Refill/Question >> Jul 08, 2018 11:58 AM Leward Quan A wrote: Medication:KLS ALLER-FLO 50 MCG/ACT nasal spray  Pharmacy received Rx earlier but need change from 16g to 79g so insurance will pay for it  Has the patient contacted their pharmacy? Yes.   (Agent: If no, request that the patient contact the pharmacy for the refill.) (Agent: If yes, when and what did the pharmacy advise?)  Preferred Pharmacy (with phone number or street name): [facility] # [address] [place] [phone] (Phone) [phone] (Fax)    Agent: Please be advised that RX refills may take up to 3 business days. We ask that you follow-up with your pharmacy.

## 2018-07-22 ENCOUNTER — Other Ambulatory Visit: Payer: Self-pay | Admitting: *Deleted

## 2018-07-22 MED ORDER — FLUTICASONE PROPIONATE 50 MCG/ACT NA SUSP: NASAL | 0 refills | Status: AC

## 2018-07-22 NOTE — Telephone Encounter (Signed)
Pharmacy called in and stated the Quantity needs to say 58 for insurance to cover

## 2018-07-22 NOTE — Addendum Note (Signed)
Addended by: Layla Barter on: 07/22/2018 03:50 PM   Modules accepted: Orders

## 2018-09-12 ENCOUNTER — Other Ambulatory Visit: Payer: Self-pay | Admitting: Family Medicine

## 2018-09-12 DIAGNOSIS — Z1231 Encounter for screening mammogram for malignant neoplasm of breast: Secondary | ICD-10-CM

## 2018-10-04 ENCOUNTER — Other Ambulatory Visit: Payer: Self-pay

## 2018-10-04 ENCOUNTER — Ambulatory Visit
Admission: RE | Admit: 2018-10-04 | Discharge: 2018-10-04 | Disposition: A | Discharge status: Home / Self Care | Payer: 59 | Source: Ambulatory Visit | Attending: Family Medicine | Admitting: Family Medicine

## 2018-10-04 DIAGNOSIS — Z1231 Encounter for screening mammogram for malignant neoplasm of breast: Secondary | ICD-10-CM

## 2019-06-09 ENCOUNTER — Other Ambulatory Visit: Payer: Self-pay | Admitting: Family Medicine

## 2019-06-10 ENCOUNTER — Other Ambulatory Visit: Payer: Self-pay | Admitting: Family Medicine

## 2019-06-10 DIAGNOSIS — R1031 Right lower quadrant pain: Secondary | ICD-10-CM

## 2019-06-17 ENCOUNTER — Ambulatory Visit
Admission: RE | Admit: 2019-06-17 | Discharge: 2019-06-17 | Disposition: A | Discharge status: Home / Self Care | Payer: 59 | Source: Ambulatory Visit | Attending: Family Medicine | Admitting: Family Medicine

## 2019-06-17 DIAGNOSIS — R1031 Right lower quadrant pain: Secondary | ICD-10-CM

## 2019-12-17 IMAGING — MG DIGITAL SCREENING BILATERAL MAMMOGRAM WITH TOMO AND CAD
8 series · 8 of 24 positions shown · non-contrast
Comparison: Previous exam(s).

CLINICAL DATA: Screening.

EXAM:
DIGITAL SCREENING BILATERAL MAMMOGRAM WITH TOMO AND CAD

[R MLO synth-2D]
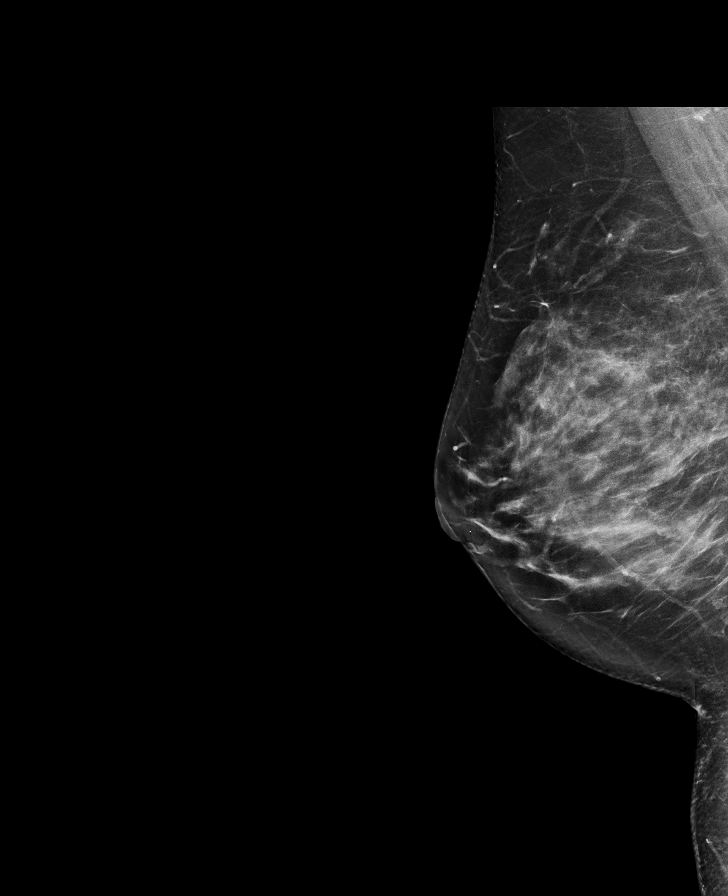

[L MLO synth-2D]
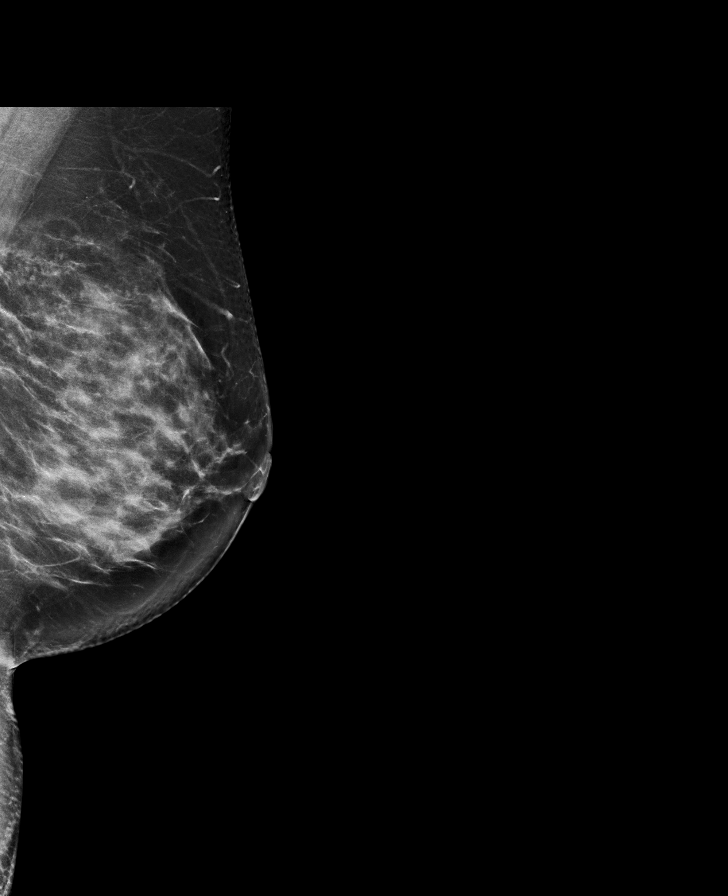

[R CC synth-2D]
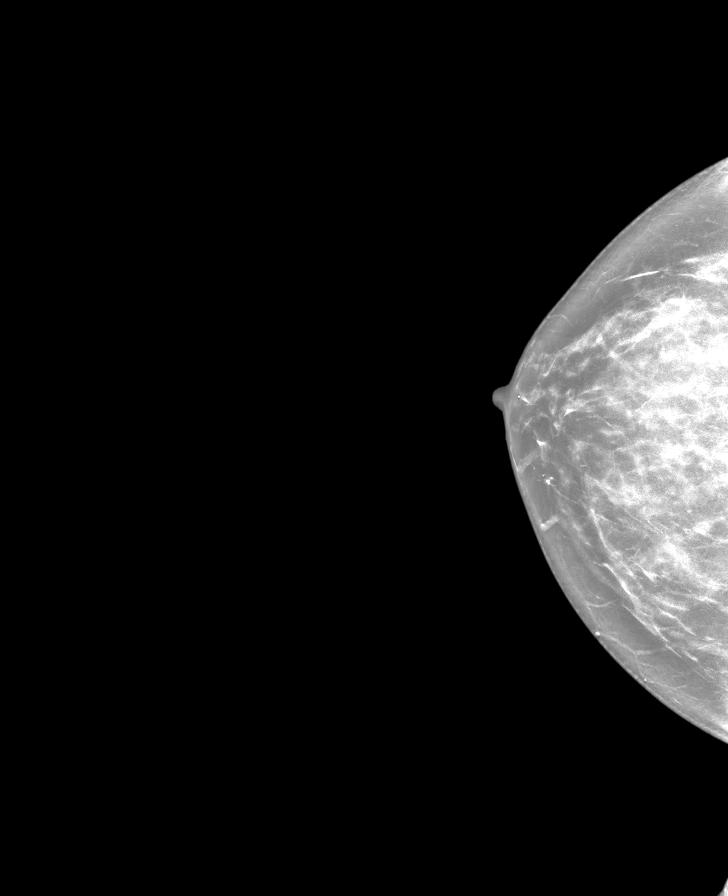

[L CC synth-2D]
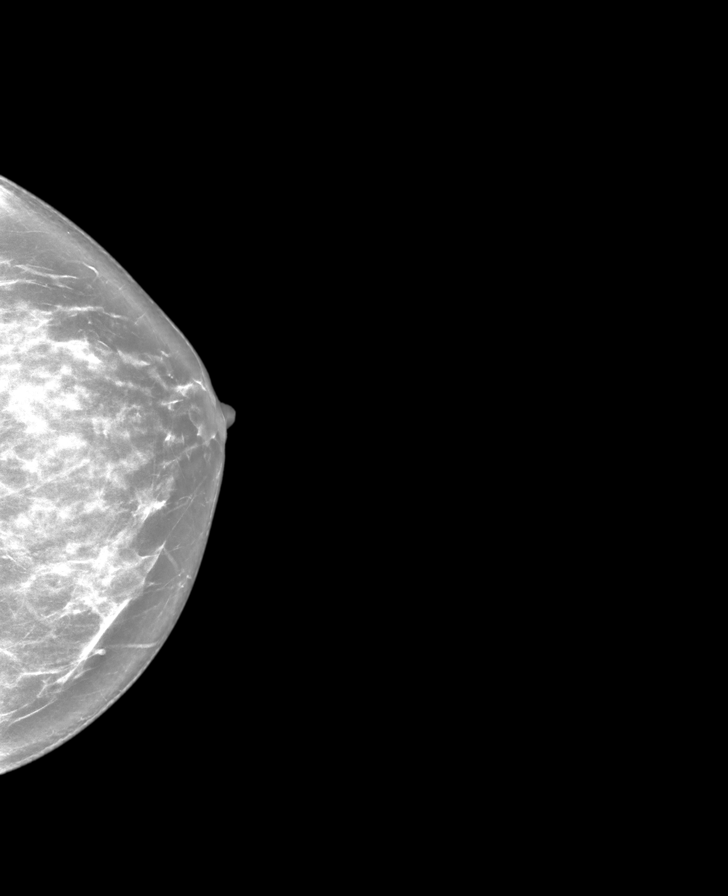

[L MLO tomo · tomo slice 44/87.0]
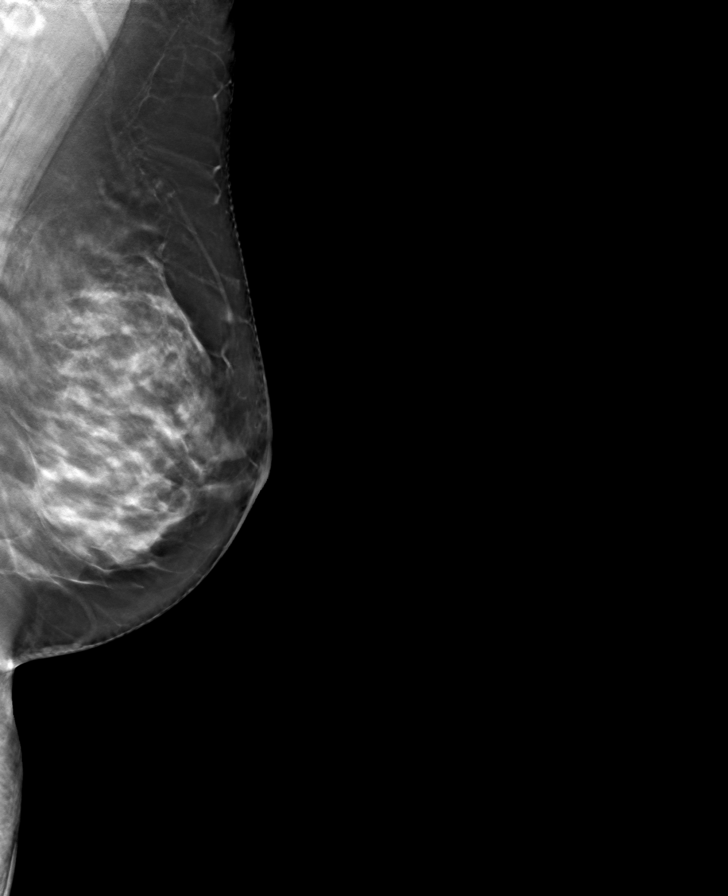

[R MLO tomo · tomo slice 43/84.0]
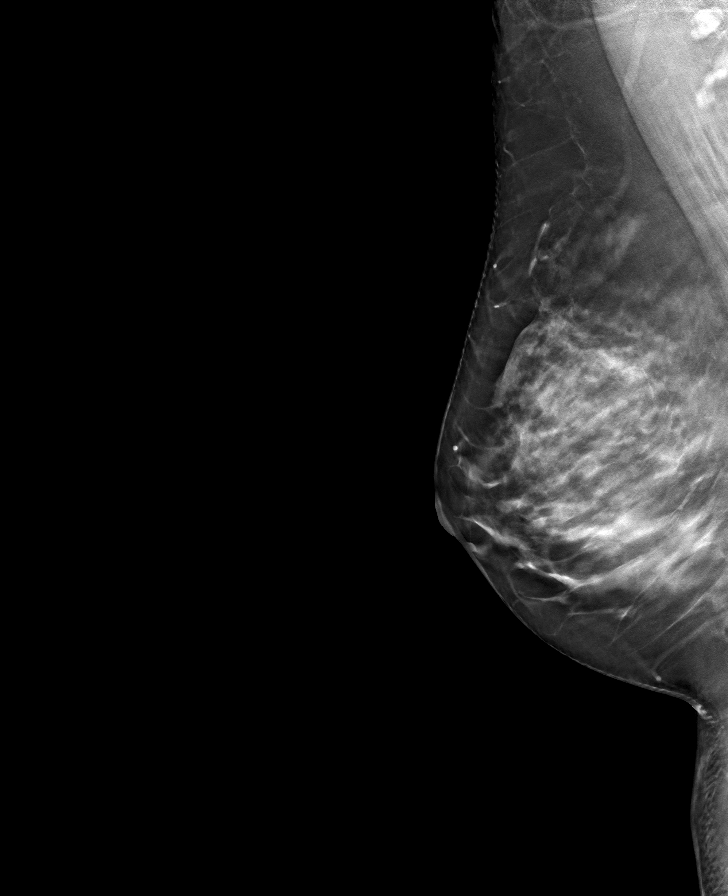

[L CC tomo · tomo slice 39/78.0]
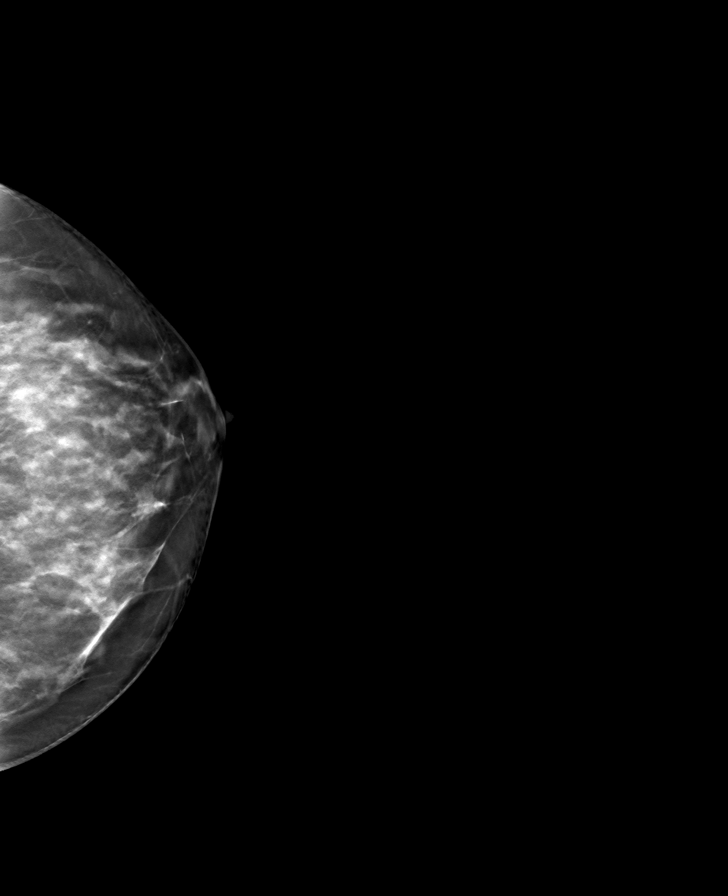

[R CC tomo · tomo slice 39/77.0]
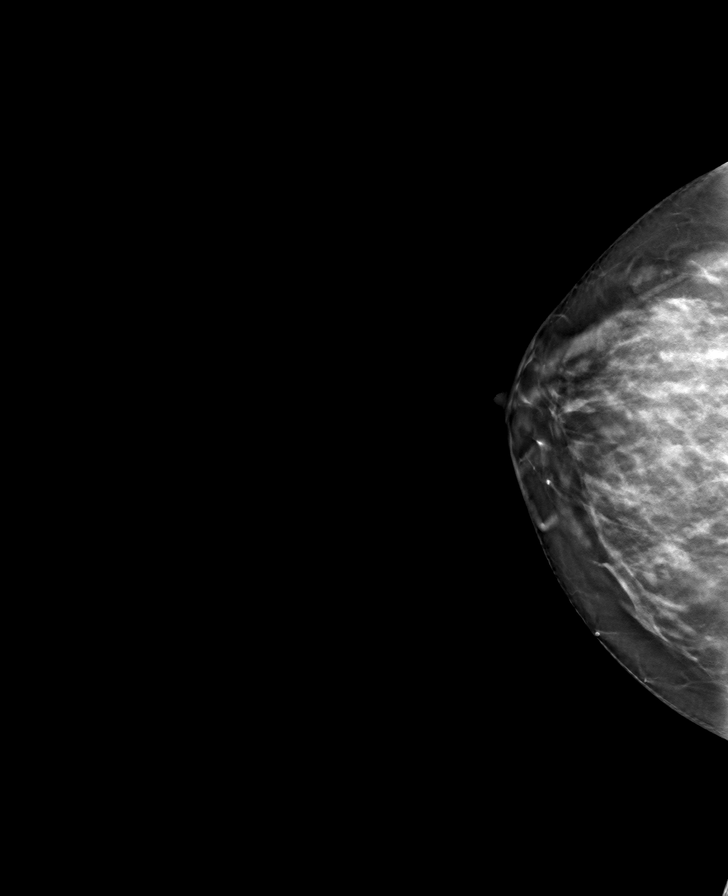

[8 of 24 positions shown; findings below may reference images not displayed]

ACR Breast Density Category c: The breast tissue is heterogeneously
dense, which may obscure small masses.
FINDINGS: There are no findings suspicious for malignancy. Images were
processed with CAD.
IMPRESSION: No mammographic evidence of malignancy. A result letter of this
screening mammogram will be mailed directly to the patient.

RECOMMENDATION:
Screening mammogram in one year. (Code:FT-U-LHB)

BI-RADS CATEGORY  1: Negative.

## 2019-12-30 ENCOUNTER — Other Ambulatory Visit: Payer: Self-pay | Admitting: Family Medicine

## 2019-12-30 DIAGNOSIS — Z1231 Encounter for screening mammogram for malignant neoplasm of breast: Secondary | ICD-10-CM

## 2020-01-01 ENCOUNTER — Other Ambulatory Visit: Payer: Self-pay | Admitting: Family Medicine

## 2020-01-01 DIAGNOSIS — R7989 Other specified abnormal findings of blood chemistry: Secondary | ICD-10-CM

## 2020-01-12 ENCOUNTER — Ambulatory Visit
Admission: RE | Admit: 2020-01-12 | Discharge: 2020-01-12 | Disposition: A | Discharge status: Home / Self Care | Payer: 59 | Source: Ambulatory Visit | Attending: Family Medicine | Admitting: Family Medicine

## 2020-01-12 ENCOUNTER — Other Ambulatory Visit: Payer: Self-pay

## 2020-01-12 DIAGNOSIS — Z1231 Encounter for screening mammogram for malignant neoplasm of breast: Secondary | ICD-10-CM

## 2020-08-29 IMAGING — US US PELVIS COMPLETE WITH TRANSVAGINAL
1 series · 14 of 25 positions shown · non-contrast
Comparison: Report 08/24/1998

CLINICAL DATA: Right lower quadrant pain

EXAM:
TRANSABDOMINAL AND TRANSVAGINAL ULTRASOUND OF PELVIS
TECHNIQUE: Both transabdominal and transvaginal ultrasound examinations of the
pelvis were performed. Transabdominal technique was performed for
global imaging of the pelvis including uterus, ovaries, adnexal
regions, and pelvic cul-de-sac. It was necessary to proceed with
endovaginal exam following the transabdominal exam to visualize the
endometrium and ovaries.

[Series 1: us pelvis complete with transvaginal · 0.13mm/px · 14 of 60 slices shown]
[im 1/60]
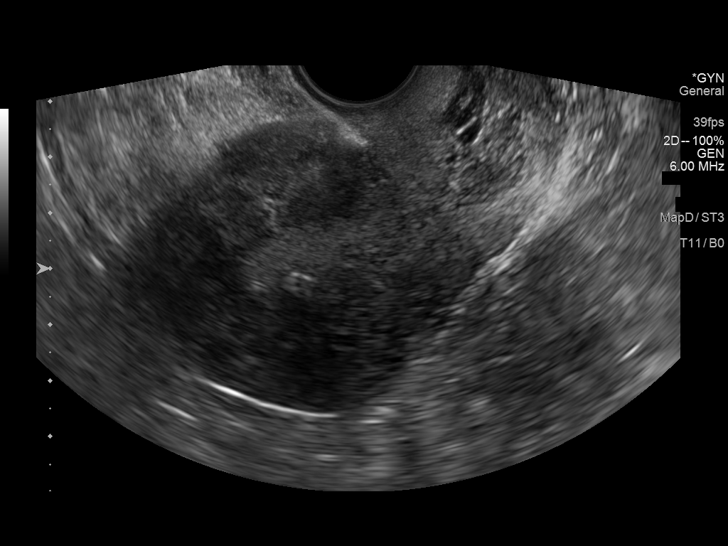
[im 5/60]
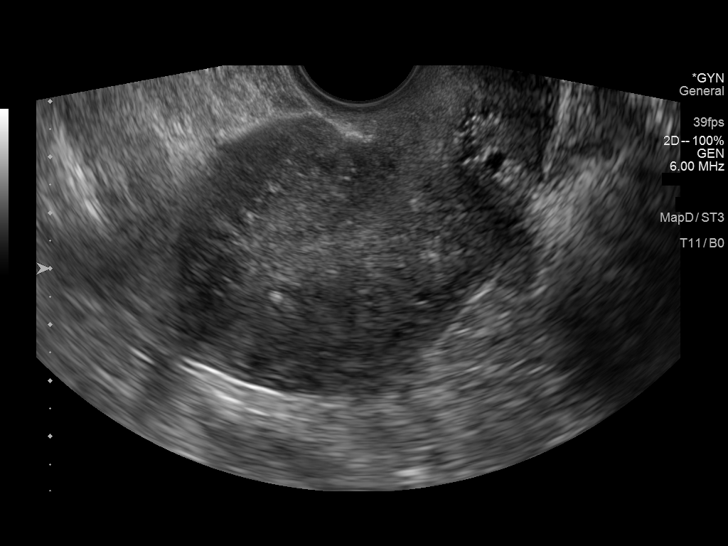
[im 10/60]
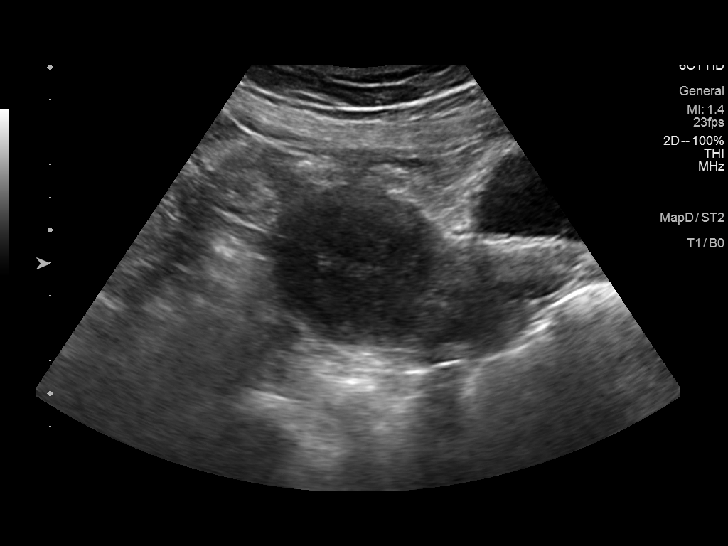
[im 15/60]
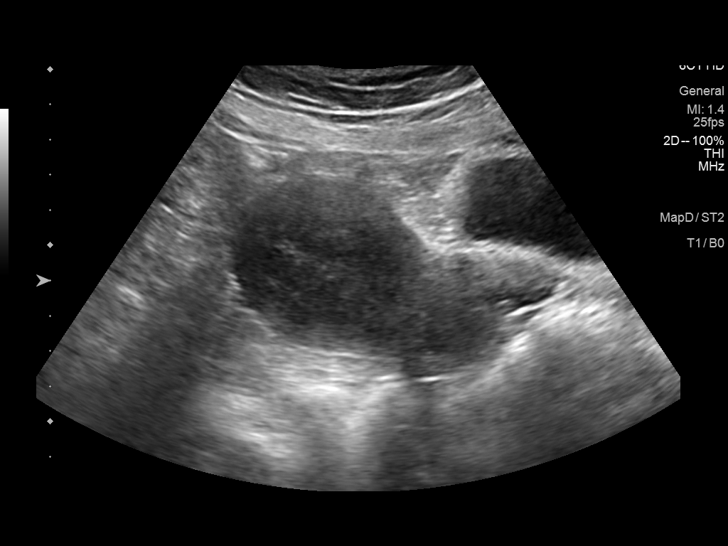
[im 20/60]
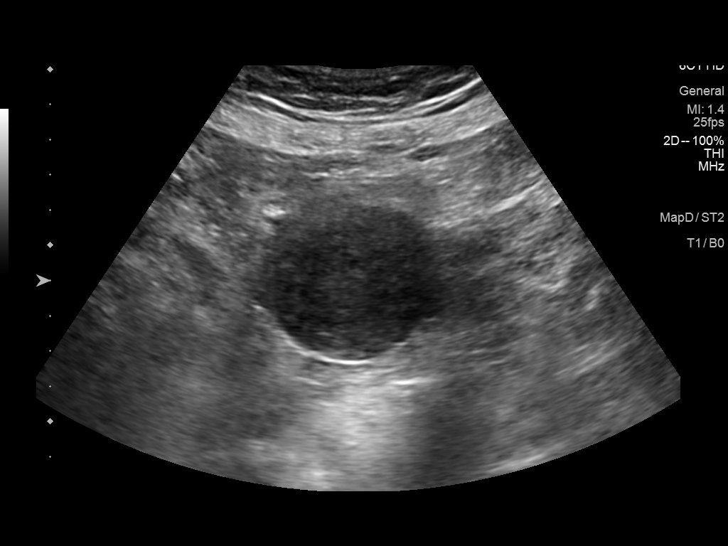
[im 23/60]
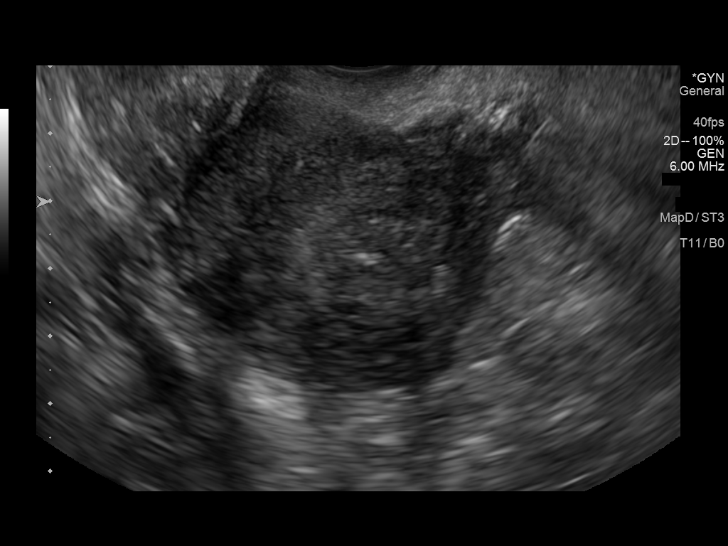
[im 28/60]
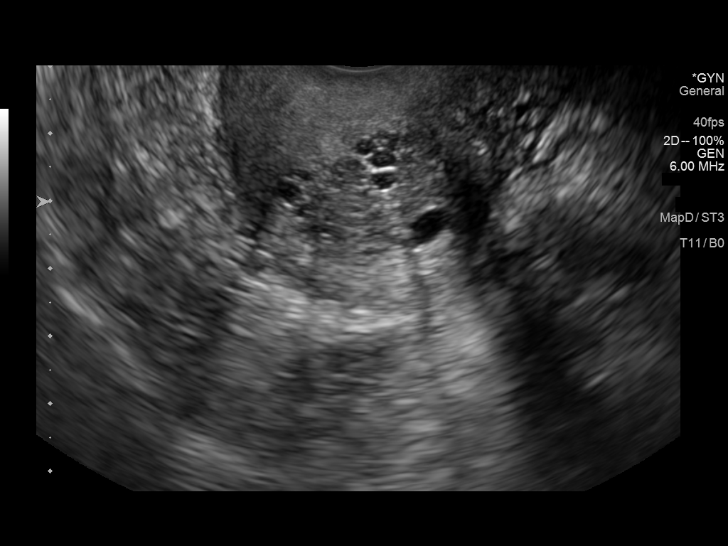
[im 32/60]
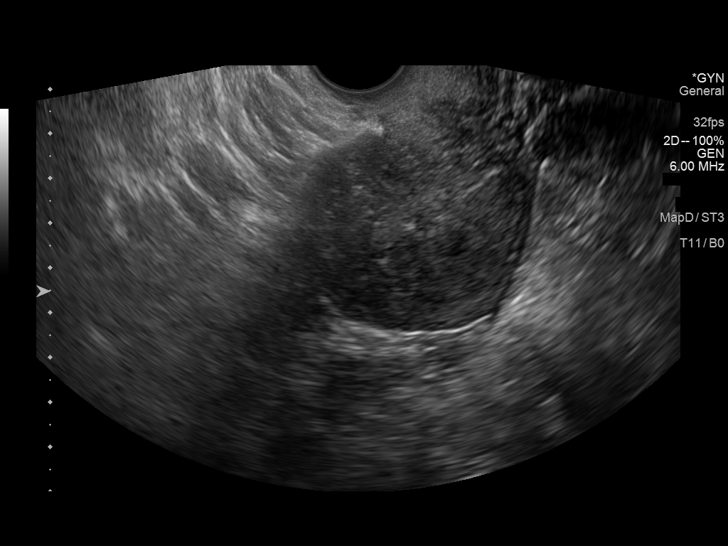
[im 37/60]
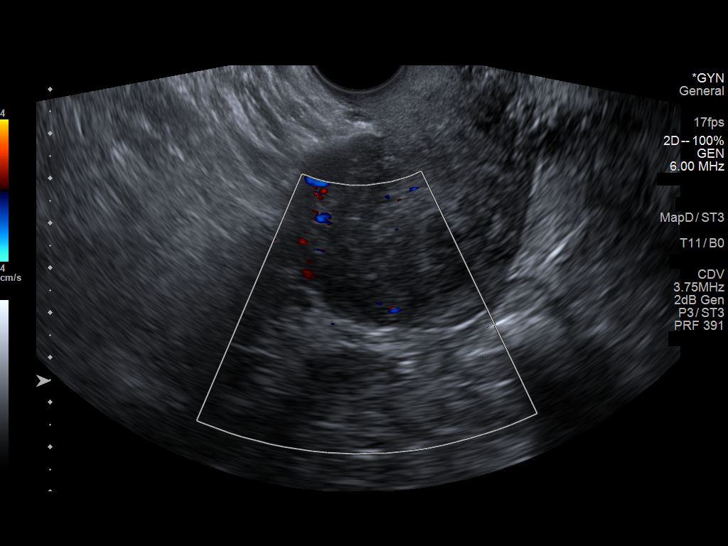
[im 40/60]
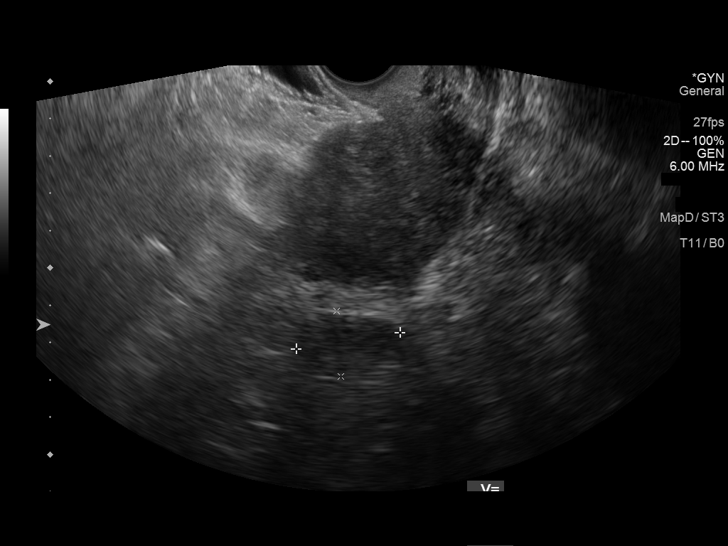
[im 45/60]
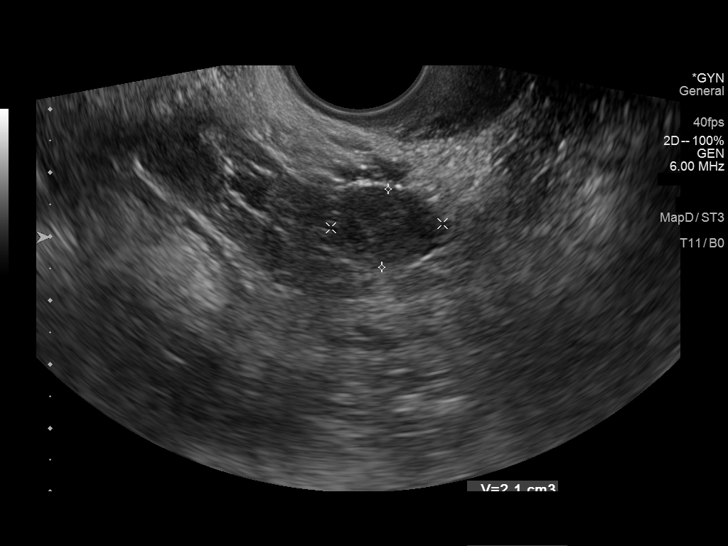
[im 50/60]
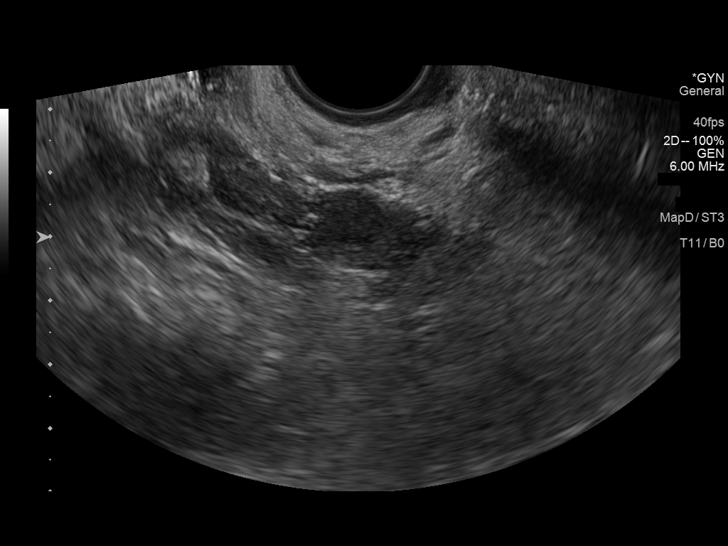
[im 55/60]
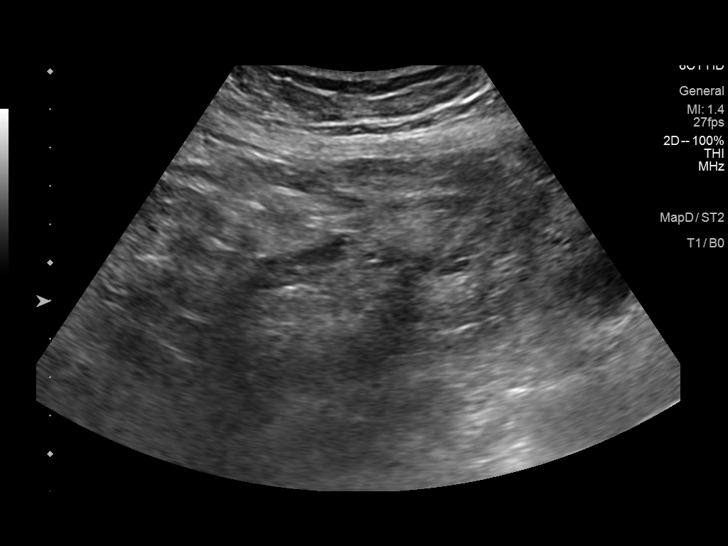
[im 60/60]
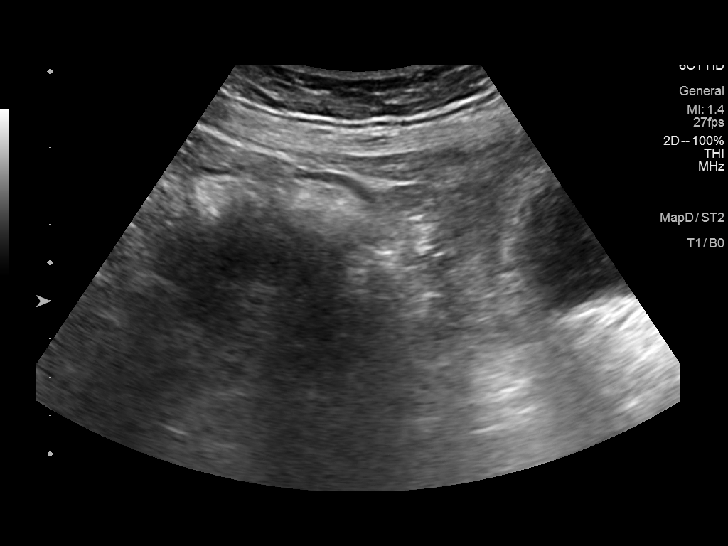

[14 of 25 positions shown; findings below may reference images not displayed]

FINDINGS: Uterus

Measurements: 8.8 x 4.9 x 5.5 cm = volume: 123 mL. No fibroids or
other mass visualized.

Endometrium

Thickness: 6.4 mm.  No focal abnormality visualized.

Right ovary

Measurements: 2.8 x 1.8 x 2.2 cm = volume: 5.6 mL. Normal
appearance/no adnexal mass.

Left ovary

Measurements: 1.8 x 1.2 x 1.8 cm = volume: 2.1 mL. Normal
appearance/no adnexal mass.

Other findings

No abnormal free fluid.
IMPRESSION: Negative pelvic ultrasound

## 2022-06-26 DIAGNOSIS — L709 Acne, unspecified: Secondary | ICD-10-CM | POA: Diagnosis not present

## 2022-06-26 DIAGNOSIS — L292 Pruritus vulvae: Secondary | ICD-10-CM | POA: Diagnosis not present

## 2022-06-26 DIAGNOSIS — N644 Mastodynia: Secondary | ICD-10-CM | POA: Diagnosis not present

## 2022-06-26 DIAGNOSIS — N915 Oligomenorrhea, unspecified: Secondary | ICD-10-CM | POA: Diagnosis not present

## 2022-06-30 DIAGNOSIS — Z1231 Encounter for screening mammogram for malignant neoplasm of breast: Secondary | ICD-10-CM | POA: Diagnosis not present
